# Patient Record
Sex: Female | Born: 1955 | Race: White | Hispanic: No | Smoking: Former smoker
Health system: Southern US, Community
[De-identification: ages and names within clinical notes are randomized; demographics above are authoritative.]

## PROBLEM LIST (undated history)

## (undated) DIAGNOSIS — M542 Cervicalgia: Secondary | ICD-10-CM

## (undated) DIAGNOSIS — M545 Low back pain, unspecified: Secondary | ICD-10-CM

## (undated) DIAGNOSIS — A770 Spotted fever due to Rickettsia rickettsii: Secondary | ICD-10-CM

## (undated) DIAGNOSIS — R5381 Other malaise: Secondary | ICD-10-CM

## (undated) DIAGNOSIS — A6920 Lyme disease, unspecified: Secondary | ICD-10-CM

## (undated) DIAGNOSIS — S82899A Other fracture of unspecified lower leg, initial encounter for closed fracture: Secondary | ICD-10-CM

## (undated) DIAGNOSIS — I10 Essential (primary) hypertension: Secondary | ICD-10-CM

## (undated) DIAGNOSIS — R5383 Other fatigue: Secondary | ICD-10-CM

## (undated) DIAGNOSIS — IMO0001 Reserved for inherently not codable concepts without codable children: Secondary | ICD-10-CM

## (undated) DIAGNOSIS — K219 Gastro-esophageal reflux disease without esophagitis: Secondary | ICD-10-CM

## (undated) DIAGNOSIS — R635 Abnormal weight gain: Secondary | ICD-10-CM

## (undated) HISTORY — DX: Low back pain: M54.5

## (undated) HISTORY — DX: Other fracture of unspecified lower leg, initial encounter for closed fracture: S82.899A

## (undated) HISTORY — DX: Cervicalgia: M54.2

## (undated) HISTORY — DX: Spotted fever due to Rickettsia rickettsii: A77.0

## (undated) HISTORY — DX: Reserved for inherently not codable concepts without codable children: IMO0001

## (undated) HISTORY — PX: ANKLE SURGERY: SHX546

## (undated) HISTORY — DX: Abnormal weight gain: R63.5

## (undated) HISTORY — DX: Other fatigue: R53.83

## (undated) HISTORY — DX: Lyme disease, unspecified: A69.20

## (undated) HISTORY — DX: Low back pain, unspecified: M54.50

## (undated) HISTORY — DX: Essential (primary) hypertension: I10

## (undated) HISTORY — DX: Other malaise: R53.81

## (undated) HISTORY — DX: Gastro-esophageal reflux disease without esophagitis: K21.9

---

## 2011-10-01 DIAGNOSIS — A6920 Lyme disease, unspecified: Secondary | ICD-10-CM

## 2011-10-01 HISTORY — DX: Lyme disease, unspecified: A69.20

## 2013-09-15 ENCOUNTER — Encounter: Payer: Self-pay | Admitting: Neurology

## 2013-09-17 ENCOUNTER — Ambulatory Visit (INDEPENDENT_AMBULATORY_CARE_PROVIDER_SITE_OTHER): Payer: BC Managed Care – PPO | Admitting: Neurology

## 2013-09-17 ENCOUNTER — Encounter: Payer: Self-pay | Admitting: Neurology

## 2013-09-17 ENCOUNTER — Encounter (INDEPENDENT_AMBULATORY_CARE_PROVIDER_SITE_OTHER): Payer: Self-pay

## 2013-09-17 VITALS — BP 141/84 | HR 54 | Ht 68.5 in | Wt 182.0 lb

## 2013-09-17 DIAGNOSIS — IMO0001 Reserved for inherently not codable concepts without codable children: Secondary | ICD-10-CM | POA: Insufficient documentation

## 2013-09-17 HISTORY — DX: Reserved for inherently not codable concepts without codable children: IMO0001

## 2013-09-17 MED ORDER — DULOXETINE HCL 30 MG PO CPEP: ORAL_CAPSULE | ORAL | Status: DC

## 2013-09-17 NOTE — Patient Instructions (Signed)

## 2013-09-17 NOTE — Progress Notes (Signed)
Reason for visit: Neuromuscular discomfort  Carmen Wallace is a 57 y.o. female  History of present illness:  Carmen Wallace is a 57 year old right-handed white female with a history of [place] spotted fever and Lyme disease at the same time. The patient indicates that she began to have some fatigue in the summer of 2013, and suddenly awakened one morning in September 2013 with difficulty with concentration, chest pain, neck stiffness, headache, and diffuse muscular discomfort. The patient went to an urgent medical care, and was sent to the hospital. The patient spent 4 days in the hospital in [place], [place] and was eventually diagnosed with [facility] spotted fever and Lyme disease. The patient was given a 30 day course of antibiotics, but the symptoms seemed to return. The patient went to a specialist for Lyme disease and was placed on antibiotics for 3 months, and her symptoms resolved for a number of months. The patient felt normal from February of 2014 until July 2014. At that point, her fatigue symptoms and muscle aching returned. The patient has had headaches off and on, and over the last 2 months, the headaches have been daily in nature. The patient does have some crepitus in the neck, and some neck stiffness. The patient has pain in the muscles of the shoulders, upper arms, back, thighs. The patient has numbness of the left arm. The patient denies any problems controlling the bowels or the bladder. The patient has no significant change in balance, no falls, and she does report some problems with concentration. The patient does not sleep well at night even though she is fatigued. The patient was given another 30 day course of antibiotics without benefit. The patient is sent to this office for further evaluation. A recent MRI of the cervical spine was done, and the disc was brought for my review. This appears to show some mild spondylosis without spinal cord involvement or nerve root  impingement by my reading. The patient is currently on no medications.  Past Medical History  Diagnosis Date  . [facility] spotted fever   . Fx ankle   . Lyme disease 2013  . Unspecified essential hypertension   . Cervicalgia   . Other malaise and fatigue   . Weight gain   . Esophageal reflux   . Lumbago   . Fatigue   . Myalgia and myositis, unspecified 09/17/2013    Past Surgical History  Procedure Laterality Date  . Ankle surgery      Fracture    Family History  Problem Relation Age of Onset  . Hypertension Mother   . Diabetes Mother   . Heart disease Mother   . Hypertension Father   . Diabetes Father   . Heart disease Sister   . Diabetes Sister     Social history:  reports that she has quit smoking. Her smoking use included Cigarettes. She smoked 0.00 packs per day for 20 years. She has never used smokeless tobacco. She reports that she drinks alcohol. She reports that she does not use illicit drugs.  Medications:  No current outpatient prescriptions on file prior to visit.   No current facility-administered medications on file prior to visit.      Allergies  Allergen Reactions  . Codeine Nausea Only  . Opium Nausea Only    ROS:  Out of a complete 14 system review of symptoms, the patient complains only of the following symptoms, and all other reviewed systems are negative.  Fatigue Ringing in  the ears Blurred vision Memory loss, confusion, headache, numbness, weakness, dizziness Achy muscles Insomnia, decreased energy, disinterest in activities, sleepiness  Blood pressure 141/84, pulse 54, height 5' 8.5" (1.74 m), weight 182 lb (82.555 kg).  Physical Exam  General: The patient is alert and cooperative at the time of the examination.  Head: Pupils are equal, round, and reactive to light. Discs are flat bilaterally.  Neck: The neck is supple, no carotid bruits are noted.  Respiratory: The respiratory examination is clear.  Cardiovascular:  The cardiovascular examination reveals a regular rate and rhythm, no obvious murmurs or rubs are noted.  Neuromuscular: The patient has good range of movement of the cervical spine and lumbosacral spine.  Skin: Extremities are without significant edema.  Neurologic Exam  Mental status: Mini-Mental status examination done today shows a total score 29/30.  Cranial nerves: Facial symmetry is present. There is good sensation of the face to pinprick and soft touch bilaterally. The strength of the facial muscles and the muscles to head turning and shoulder shrug are normal bilaterally. Speech is well enunciated, no aphasia or dysarthria is noted. Extraocular movements are full. Visual fields are full.  Motor: The motor testing reveals 5 over 5 strength of all 4 extremities. Good symmetric motor tone is noted throughout.  Sensory: Sensory testing is intact to pinprick, soft touch, vibration sensation, and position sense on all 4 extremities. No evidence of extinction is noted.  Coordination: Cerebellar testing reveals good finger-nose-finger and heel-to-shin bilaterally.  Gait and station: Gait is normal. Tandem gait is normal. Romberg is negative. No drift is seen.  Reflexes: Deep tendon reflexes are symmetric and normal bilaterally. Toes are downgoing bilaterally.   Assessment/Plan:  1. Probable fibromyalgia  2. History of Lyme disease and [place] spotted fever  The patient has a normal clinical examination at this time. The patient reports symptoms that are fully consistent with fibromyalgia. The patient will undergo further blood work evaluation, and undergo MRI evaluation of the brain to exclude demyelinating disease. The patient will be placed on Cymbalta, and she will followup in 3-4 months.  Marlan Palau MD 09/18/2013 9:20 AM  [facility] [address] [place], [place] [address]  Phone [phone] Fax [phone]

## 2013-09-20 ENCOUNTER — Telehealth: Payer: Self-pay | Admitting: Neurology

## 2013-09-20 LAB — COPPER, SERUM: Copper: 100 ug/dL (ref 72–166)

## 2013-09-20 LAB — ENA+DNA/DS+SJORGEN'S
ENA RNP Ab: 1.8 AI — ABNORMAL HIGH (ref 0.0–0.9)
ENA SSA (RO) Ab: 0.2 AI (ref 0.0–0.9)
dsDNA Ab: <1 IU/mL (ref 0–9)

## 2013-09-20 LAB — ANGIOTENSIN CONVERTING ENZYME: Angio Convert Enzyme: 46 U/L (ref 14–82)

## 2013-09-20 LAB — ANA W/REFLEX: Anti Nuclear Antibody(ANA): POSITIVE — AB

## 2013-09-20 LAB — HIV ANTIBODY (ROUTINE TESTING W REFLEX): HIV 1/O/2 Abs-Index Value: <1 (ref <1.00)

## 2013-09-20 LAB — RHEUMATOID FACTOR: Rhuematoid fact SerPl-aCnc: 8.1 IU/mL (ref 0.0–13.9)

## 2013-09-20 NOTE — Telephone Encounter (Signed)
I called the patient. The blood work was unremarkable with exception that the ANA was positive in low titer with the RN P. antibody. I'm not sure that this has any clinical significance. I'll contact the patient once the MRI of the brain is done.

## 2013-10-12 ENCOUNTER — Ambulatory Visit
Admission: RE | Admit: 2013-10-12 | Discharge: 2013-10-12 | Disposition: A | Discharge status: Home / Self Care | Payer: BC Managed Care – PPO | Source: Ambulatory Visit | Attending: Neurology | Admitting: Neurology

## 2013-10-12 DIAGNOSIS — IMO0001 Reserved for inherently not codable concepts without codable children: Secondary | ICD-10-CM

## 2013-10-13 ENCOUNTER — Telehealth: Payer: Self-pay | Admitting: Neurology

## 2013-10-13 NOTE — Telephone Encounter (Signed)
I called patient. MRI the brain was normal. The patient has a left parotid cyst, and evidence of lymph node enlargements in the neck area. If this has not been addressed, a lymph node biopsy may be important in the future, as it may be relevant to her diagnosis.

## 2013-10-14 DIAGNOSIS — IMO0001 Reserved for inherently not codable concepts without codable children: Secondary | ICD-10-CM

## 2013-11-19 ENCOUNTER — Telehealth: Payer: Self-pay

## 2013-11-19 ENCOUNTER — Other Ambulatory Visit: Payer: Self-pay

## 2013-11-19 DIAGNOSIS — C20 Malignant neoplasm of rectum: Secondary | ICD-10-CM

## 2013-11-19 NOTE — Telephone Encounter (Signed)
Left message on machine to call back  

## 2013-11-19 NOTE — Telephone Encounter (Signed)
EUS 11/25/13 730 am needs instrucitons

## 2013-11-22 NOTE — Telephone Encounter (Signed)
Pt went to [facility]

## 2013-11-25 ENCOUNTER — Encounter (HOSPITAL_COMMUNITY): Admission: RE | Payer: Self-pay | Source: Ambulatory Visit

## 2013-11-25 ENCOUNTER — Ambulatory Visit (HOSPITAL_COMMUNITY)
Admission: RE | Admit: 2013-11-25 | Payer: BC Managed Care – PPO | Source: Ambulatory Visit | Admitting: Gastroenterology

## 2013-11-25 SURGERY — ULTRASOUND, LOWER GI TRACT, ENDOSCOPIC
Anesthesia: Moderate Sedation

## 2014-01-13 ENCOUNTER — Ambulatory Visit: Payer: BC Managed Care – PPO | Admitting: Nurse Practitioner

## 2014-01-13 ENCOUNTER — Encounter: Payer: Self-pay | Admitting: Neurology

## 2014-02-24 ENCOUNTER — Encounter: Payer: Self-pay | Admitting: Adult Health

## 2014-02-24 ENCOUNTER — Ambulatory Visit (INDEPENDENT_AMBULATORY_CARE_PROVIDER_SITE_OTHER): Payer: BC Managed Care – PPO | Admitting: Adult Health

## 2014-02-24 VITALS — BP 129/86 | HR 64 | Ht 67.75 in | Wt 189.0 lb

## 2014-02-24 DIAGNOSIS — R519 Headache, unspecified: Secondary | ICD-10-CM | POA: Insufficient documentation

## 2014-02-24 DIAGNOSIS — IMO0001 Reserved for inherently not codable concepts without codable children: Secondary | ICD-10-CM

## 2014-02-24 DIAGNOSIS — R200 Anesthesia of skin: Secondary | ICD-10-CM

## 2014-02-24 DIAGNOSIS — R209 Unspecified disturbances of skin sensation: Secondary | ICD-10-CM

## 2014-02-24 DIAGNOSIS — R51 Headache: Secondary | ICD-10-CM

## 2014-02-24 MED ORDER — DULOXETINE HCL 30 MG PO CPEP: ORAL_CAPSULE | ORAL | Status: AC

## 2014-02-24 NOTE — Progress Notes (Signed)
I have read the note, and I agree with the clinical assessment and plan.  Charles K Willis   

## 2014-02-24 NOTE — Progress Notes (Signed)
Carmen Wallace: Carmen Wallace DOB: July 19, 1956  REASON FOR VISIT: follow up HISTORY FROM: Carmen Wallace  HISTORY OF PRESENT ILLNESS: Carmen Wallace is a 58 year old right handed white female with a history of [place] fever, lyme disease and fibromyalgia. She returns today for a follow-up. Carmen Wallace was started on Cymbalta and reports that it helped initially but she was diagnosed with rectal cancer in February and stopped the medication. She was started on chemotherapy and radiation and therefore stopped the Cymbalta while doing treatment. She has been done with treatment since April and would like to start the Cymbalta back. She has surgery scheduled June 10th to remove cancer. She states that afterwards they may have to do more chemotherapy. She continues to have headaches and muscle aches. Carmen Wallace reports that she has tinnitus after being diagnosed lyme disease. She also has some numbness and tingling in the hands that started after being diagnosed with lyme disease. However she states that she has had numbness and tingling in her left arm that was there before lyme disease. States that she will wake up in the middle of the night and her hands will be numb. She returns today for an evaluation.   History 09/17/13 (CW): Carmen Wallace is a 58 year old right-handed white female with a history of [facility] spotted fever and Lyme disease at the same time. The Carmen Wallace indicates that she began to have some fatigue in the summer of 2013, and suddenly awakened one morning in September 2013 with difficulty with concentration, chest pain, neck stiffness, headache, and diffuse muscular discomfort. The Carmen Wallace went to an urgent medical care, and was sent to the hospital. The Carmen Wallace spent 4 days in the hospital in [place], [place] and was eventually diagnosed with [facility] spotted fever and Lyme disease. The Carmen Wallace was given a 30 day course of antibiotics, but the symptoms seemed to return. The Carmen Wallace  went to a specialist for Lyme disease and was placed on antibiotics for 3 months, and her symptoms resolved for a number of months. The Carmen Wallace felt normal from February of 2014 until July 2014. At that point, her fatigue symptoms and muscle aching returned. The Carmen Wallace has had headaches off and on, and over the last 2 months, the headaches have been daily in nature. The Carmen Wallace does have some crepitus in the neck, and some neck stiffness. The Carmen Wallace has pain in the muscles of the shoulders, upper arms, back, thighs. The Carmen Wallace has numbness of the left arm. The Carmen Wallace denies any problems controlling the bowels or the bladder. The Carmen Wallace has no significant change in balance, no falls, and she does report some problems with concentration. The Carmen Wallace does not sleep well at night even though she is fatigued. The Carmen Wallace was given another 30 day course of antibiotics without benefit. The Carmen Wallace is sent to this office for further evaluation. A recent MRI of the cervical spine was done, and the disc was brought for my review. This appears to show some mild spondylosis without spinal cord involvement or nerve root impingement by my reading. The Carmen Wallace is currently on no medications.  REVIEW OF SYSTEMS: Full 14 system review of systems performed and notable only for:  Constitutional: activity change, fatigue Eyes: N/A Ear/Nose/Throat: ear pain, ringing in ears  Skin: itching   Cardiovascular: palpitations   Respiratory: cough Gastrointestinal: rectal pain, incontinence of bowels  Genitourinary: incontinence of bladder, frequency of urination, urgency Hematology/Lymphatic: N/A  Endocrine: N/A Musculoskeletal:N/A  Allergy/Immunology: N/A  Neurological: memory  loss, headache, numbness, weakness Psychiatric: agitation, depression, nervous.anxious Sleep: insomnia, daytime sleepiness   ALLERGIES: Allergies  Allergen Reactions  . Codeine Nausea Only  . Opium Nausea Only    HOME  MEDICATIONS: Outpatient Prescriptions Prior to Visit  Medication Sig Dispense Refill  . DULoxetine (CYMBALTA) 30 MG capsule One tablet daily for 2 weeks, then begin 1 tablet twice daily  60 capsule  3   No facility-administered medications prior to visit.    PAST MEDICAL HISTORY: Past Medical History  Diagnosis Date  . [facility] spotted fever   . Fx ankle   . Lyme disease 2013  . Unspecified essential hypertension   . Cervicalgia   . Other malaise and fatigue   . Weight gain   . Esophageal reflux   . Lumbago   . Fatigue   . Myalgia and myositis, unspecified 09/17/2013    PAST SURGICAL HISTORY: Past Surgical History  Procedure Laterality Date  . Ankle surgery      Fracture    FAMILY HISTORY: Family History  Problem Relation Age of Onset  . Hypertension Mother   . Diabetes Mother   . Heart disease Mother   . Hypertension Father   . Diabetes Father   . Heart disease Sister   . Diabetes Sister     SOCIAL HISTORY: History   Social History  . Marital Status: Married    Spouse Name: Carmen Wallace    Number of Children: 2  . Years of Education: College   Occupational History  .     Social History Main Topics  . Smoking status: Former Smoker -- 20 years    Types: Cigarettes  . Smokeless tobacco: Never Used     Comment: QUIT 2006  . Alcohol Use: No     Comment: occasional- not now  . Drug Use: No  . Sexual Activity: Not on file   Other Topics Concern  . 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   Carmen Wallace has two children.   Carmen Wallace is currently on medical leave.   Carmen Wallace has a college education.   Carmen Wallace is right-handed.   Carmen Wallace drinks one cup of coffee every morning.      PHYSICAL EXAM  Filed Vitals:   02/24/14 1019  BP: 129/86  Pulse: 64  Height: 5' 7.75" (1.721 m)  Weight: 189 lb (85.73 kg)   Body mass index is 28.94 kg/(m^2).  Generalized: Well  developed, in no acute distress   Neurological examination  Mentation: Alert oriented to time, place, history taking. Follows all commands speech and language fluent Cranial nerve II-XII:  Extraocular movements were full, visual field were full on confrontational test. Motor: The motor testing reveals 5 over 5 strength of all 4 extremities. Good symmetric motor tone is noted throughout.  Sensory: Sensory testing is intact to soft touch on all 4 extremities. No evidence of extinction is noted. Negative tinel's sign. Coordination: Cerebellar testing reveals good finger-nose-finger and heel-to-shin bilaterally.  Gait and station: Gait is normal. Tandem gait is normal. Romberg is negative. No drift is seen.  Reflexes: Deep tendon reflexes are symmetric and normal bilaterally.     DIAGNOSTIC DATA (LABS, IMAGING, TESTING) - I reviewed Carmen Wallace records, labs, notes, testing and imaging myself where available.    ASSESSMENT AND PLAN 58 y.o. year old female  has a past medical history of [facility] spotted fever; Fx ankle; Lyme disease (2013);  Unspecified essential hypertension; Cervicalgia; Other malaise and fatigue; Weight gain; Esophageal reflux; Lumbago; Fatigue; and Myalgia and myositis, unspecified (09/17/2013). here with   1. Myalgia and myositis, unspecified 2. Headache(784.0) 3. Numbness in both hands  Carmen Wallace was diagnosed with rectal cancer and started on chemotherapy and radiation. Carmen Wallace stopped taking Cymbalta while she was receiving treatment. She has completed chemo for now and would like to restart Cymbalta. She continues to have muscles aches and headaches. We will restart Cymbalta, Carmen Wallace should take 1 tablet (30 mg) daily for 2 weeks then increase to 1 tablet BID. If the Carmen Wallace is tolerating this medication and finds it beneficial we will taper her off the Prozac. Carmen Wallace is currently on low dose Prozac for anxiety and depression. Carmen Wallace is also having some numbness and  tingling in both hands which could be related to lyme disease however Carmen Wallace reports that she has had numbness and tingling in the left hand and arm before she was diagnosed with lyme disease. We will order nerve conduction studies on both arms. Carmen Wallace should follow up in 6 months or sooner if needed.    Ward Givens, MSN, NP-C 02/24/2014, 10:32 AM [facility] [address], [place], [place] [address] [phone]  Note: This document was prepared with digital dictation and possible smart phrase technology. Any transcriptional errors that result from this process are unintentional.

## 2014-02-28 ENCOUNTER — Telehealth: Payer: Self-pay | Admitting: Radiology

## 2014-02-28 ENCOUNTER — Encounter: Payer: BC Managed Care – PPO | Admitting: Radiology

## 2014-02-28 NOTE — Telephone Encounter (Signed)
Patient called and cancelled NCV only  appt. < ,24 hours.

## 2014-04-04 ENCOUNTER — Ambulatory Visit (INDEPENDENT_AMBULATORY_CARE_PROVIDER_SITE_OTHER): Payer: BC Managed Care – PPO

## 2014-04-04 DIAGNOSIS — G5600 Carpal tunnel syndrome, unspecified upper limb: Secondary | ICD-10-CM

## 2014-04-04 DIAGNOSIS — R200 Anesthesia of skin: Secondary | ICD-10-CM

## 2014-04-04 DIAGNOSIS — R209 Unspecified disturbances of skin sensation: Secondary | ICD-10-CM

## 2014-04-04 DIAGNOSIS — G5602 Carpal tunnel syndrome, left upper limb: Secondary | ICD-10-CM

## 2014-04-05 ENCOUNTER — Telehealth: Payer: Self-pay | Admitting: Neurology

## 2014-04-05 NOTE — Telephone Encounter (Signed)
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 the patient is to get a wrist splint and wear this over the next 6-8 weeks. If no benefit is noted, she will contact our office, and we will consider a referral to a hand surgeon.

## 2014-04-05 NOTE — Procedures (Signed)
     HISTORY:  Carmen Wallace is a 58 year old patient with a history of possible Lyme disease. She is reporting some numbness of the left hand and arm. She is being evaluated for possible neuropathy.  NERVE CONDUCTION STUDIES:  Nerve conduction studies were performed on both upper extremities. The distal motor latencies for the median nerves were prolonged on the left, normal on the right, with normal motor amplitudes for these nerves bilaterally. The distal motor latencies and motor amplitudes for the ulnar nerves were normal bilaterally. The F wave latencies and nerve conduction velocities for the median and ulnar nerves were normal bilaterally. The sensory latencies for the median nerves were prolonged on the left, normal on the right, and normal for the ulnar nerves bilaterally.  EMG STUDIES:  EMG evaluation was not performed.  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 There is no evidence of a left ulnar neuropathy on this study. No other significant abnormalities were seen.  Jill Alexanders MD 04/05/2014 7:48 AM  [facility] [address] [place], [place] [address]  Phone [phone] Fax [phone]

## 2014-04-19 ENCOUNTER — Ambulatory Visit: Payer: Self-pay | Admitting: Neurology

## 2014-04-28 ENCOUNTER — Encounter: Payer: Self-pay | Admitting: Adult Health

## 2014-05-03 ENCOUNTER — Ambulatory Visit: Payer: Self-pay | Admitting: Nurse Practitioner

## 2014-09-02 ENCOUNTER — Ambulatory Visit: Payer: BC Managed Care – PPO | Admitting: Adult Health

## 2017-06-11 ENCOUNTER — Encounter (INDEPENDENT_AMBULATORY_CARE_PROVIDER_SITE_OTHER): Payer: Self-pay | Admitting: Orthopedic Surgery

## 2017-06-11 ENCOUNTER — Ambulatory Visit (INDEPENDENT_AMBULATORY_CARE_PROVIDER_SITE_OTHER): Payer: Medicare Other

## 2017-06-11 ENCOUNTER — Ambulatory Visit (INDEPENDENT_AMBULATORY_CARE_PROVIDER_SITE_OTHER): Payer: Medicare Other | Admitting: Orthopedic Surgery

## 2017-06-11 VITALS — Resp 12 | Ht 69.0 in | Wt 175.0 lb

## 2017-06-11 DIAGNOSIS — M659 Synovitis and tenosynovitis, unspecified: Secondary | ICD-10-CM

## 2017-06-11 MED ORDER — DICLOFENAC SODIUM 1 % TD GEL: 2.0000 g | Freq: Four times a day (QID) | TRANSDERMAL | 1 refills | Status: AC

## 2017-06-11 NOTE — Progress Notes (Signed)
Office Visit Note   Patient: Carmen Wallace           Date of Birth: Oct 16, 1955           MRN: [id number] Visit Date: 06/11/2017              Requested by: Glenda Chroman, MD [place], [place] [address] PCP: Glenda Chroman, MD   Assessment & Plan: Visit Diagnoses:  1. Synovitis of left foot     Plan:  #1: Placement into a short Equalizer boot to allow the joint synovitis to improve. #2: Given her Voltaren gel as an anti-inflammatory to be used on the foot. She does have a history of rectal cancer on joint to try to avoid any anti-inflammatories orally.  Follow-Up Instructions: Return in about 2 weeks (around 06/25/2017).   Orders:  Orders Placed This Encounter  Procedures  . XR Foot Complete Left   Meds ordered this encounter  Medications  . diclofenac sodium (VOLTAREN) 1 % GEL    Sig: Apply 2 g topically 4 (four) times daily.    Dispense:  5 Tube    Refill:  1    Order Specific Question:   Supervising Provider    Answer:   Garald Balding [[id number]]      Procedures: No procedures performed   Clinical Data: No additional findings.   Subjective: Chief Complaint  Patient presents with  . Left Foot - Pain, Edema    Carmen Wallace is a 61 y o that presents with Left foot pain x 2 weeks. She relates it aches, pt works 5x week, 12hrs a day.    HPI  Carmen Wallace is a very pleasant 61 year old white female who is seen today for evaluation of of her left foot.Marland Kitchen History is that she's had 2 weeks of pain discomfort over the dorsum of her foot. 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 She stands on concrete during that time. She is placed inserts in her shoes and also been using Epsom salts soaks. Because of her continued pain and discomfort she is seen today for evaluation. She denies any numbness or tingling.  Review of Systems  Constitutional: Negative for chills, fatigue and fever.  Eyes: Negative for itching.  Respiratory: Negative for chest tightness and shortness  of breath.   Cardiovascular: Negative for chest pain, palpitations and leg swelling.  Gastrointestinal: Negative for blood in stool, constipation and diarrhea.       History of rectal cancer with resection this is stage IV  Endocrine: Negative for polyuria.  Genitourinary: Negative for dysuria.  Musculoskeletal: Negative for back pain, joint swelling, neck pain and neck stiffness.  Allergic/Immunologic: Negative for immunocompromised state.  Neurological: Negative for dizziness and numbness.  Hematological: Does not bruise/bleed easily.  Psychiatric/Behavioral: The patient is not nervous/anxious.      Objective: Vital Signs: Resp 12   Ht 5\' 9"  (1.753 m)   Wt 175 lb (79.4 kg)   BMI 25.84 kg/m   Physical Exam  Constitutional: She is oriented to person, place, and time. She appears well-developed and well-nourished.  HENT:  Head: Normocephalic and atraumatic.  Eyes: Pupils are equal, round, and reactive to light. EOM are normal.  Pulmonary/Chest: Effort normal.  Neurological: She is alert and oriented to person, place, and time.  Skin: Skin is warm and dry.  Psychiatric: She has a normal mood and affect. Her behavior is normal. Judgment and thought content normal.    Ortho  Exam  She has tenderness to palpation over the tarsometatarsal area of the midfoot near the second third and fourth metatarsals. A little bit of warmth is palpated. No pain with twisting of the midfoot. Flexors and extensors are intact but does cause her some pain with the dorsiflexion of the toes. Good capillary refill. Sensation is intact to light touch.  Specialty Comments:  No specialty comments available.  Imaging: Xr Foot Complete Left  Result Date: 06/11/2017 He does have some cystic changes in the medial heel cuboid. There is also some cystic changes at the first and second cuneiform and the second metatarsal area.    PMFS History: Patient Active Problem List   Diagnosis Date Noted  .  Headache(784.0) 02/24/2014  . Myalgia and myositis, unspecified 09/17/2013   Past Medical History:  Diagnosis Date  . Cervicalgia   . Esophageal reflux   . Fatigue   . Fx ankle   . Lumbago   . Lyme disease 2013  . Myalgia and myositis, unspecified 09/17/2013  . Other malaise and fatigue   . [facility] spotted fever   . Unspecified essential hypertension   . Weight gain     Family History  Problem Relation Age of Onset  . Hypertension Mother   . Diabetes Mother   . Heart disease Mother   . Hypertension Father   . Diabetes Father   . Heart disease Sister   . Diabetes Sister     Past Surgical History:  Procedure Laterality Date  . ANKLE SURGERY     Fracture   Social History   Occupational History  .  [employer]   Social History Main Topics  . Smoking status: Former Smoker    Years: 20.00    Types: Cigarettes  . Smokeless tobacco: Never Used     Comment: QUIT 2006  . Alcohol use No     Comment: occasional- not now  . Drug use: No  . Sexual activity: Not on file

## 2017-06-25 ENCOUNTER — Ambulatory Visit (INDEPENDENT_AMBULATORY_CARE_PROVIDER_SITE_OTHER): Payer: Medicare (Managed Care) | Admitting: Orthopaedic Surgery

## 2018-07-03 ENCOUNTER — Other Ambulatory Visit: Payer: Self-pay | Admitting: Internal Medicine

## 2018-07-03 DIAGNOSIS — N644 Mastodynia: Secondary | ICD-10-CM

## 2018-07-03 DIAGNOSIS — Z1231 Encounter for screening mammogram for malignant neoplasm of breast: Secondary | ICD-10-CM

## 2018-07-22 ENCOUNTER — Other Ambulatory Visit: Payer: Self-pay

## 2018-07-29 ENCOUNTER — Ambulatory Visit
Admission: RE | Admit: 2018-07-29 | Discharge: 2018-07-29 | Disposition: A | Discharge status: Home / Self Care | Payer: Medicare Other | Source: Ambulatory Visit | Attending: Internal Medicine | Admitting: Internal Medicine

## 2018-07-29 ENCOUNTER — Ambulatory Visit: Payer: Self-pay

## 2018-07-29 DIAGNOSIS — N644 Mastodynia: Secondary | ICD-10-CM

## 2018-09-17 ENCOUNTER — Telehealth: Payer: Self-pay | Admitting: Orthopedic Surgery

## 2018-09-17 NOTE — Telephone Encounter (Signed)
Call from patient inquiring about scheduling appointment with Dr Aline Brochure specifically, as she was recommended to him for an injury to left lower leg which occurred today - thinks she may have hyper-extended it.  I scheduled first available in the new year, and added to cancellation list. Discussed options of primary care, urgent care, Emergency room.  Patient will follow through on one of these options.

## 2018-10-05 ENCOUNTER — Ambulatory Visit: Payer: Medicare Other | Admitting: Orthopedic Surgery

## 2018-10-05 ENCOUNTER — Encounter

## 2018-10-05 ENCOUNTER — Ambulatory Visit (INDEPENDENT_AMBULATORY_CARE_PROVIDER_SITE_OTHER): Payer: Medicare Other

## 2018-10-05 ENCOUNTER — Encounter: Payer: Self-pay | Admitting: Orthopedic Surgery

## 2018-10-05 VITALS — BP 146/95 | HR 65 | Ht 68.0 in | Wt 191.0 lb

## 2018-10-05 DIAGNOSIS — M25561 Pain in right knee: Secondary | ICD-10-CM

## 2018-10-05 DIAGNOSIS — M7631 Iliotibial band syndrome, right leg: Secondary | ICD-10-CM

## 2018-10-05 NOTE — Progress Notes (Signed)
NEW PATIENT OFFICE VISIT  Chief Complaint  Patient presents with  . Knee Pain    Since 12/18 not sure of how it happened but had immediate pain.    63 year old female presents with pain in her right knee after an injury in mid December  She says she was trying to block a dog from getting out of a kennel dog ran into her right leg she felt acute pain in the front of the proximal tibia and knee area could not bear weight.  She treated herself with ice Ace wrap Tylenol Advil with moderate effect.  She can now weight-bear but still has a burning sensation on the lateral part of the leg along the iliotibial band it is mild to moderate it associated with bending the knee it is not associated with any catching locking or giving way   Review of Systems  Skin: Negative for itching and rash.  Neurological: Negative for tingling and sensory change.     Past Medical History:  Diagnosis Date  . Cervicalgia   . Esophageal reflux   . Fatigue   . Fx ankle   . Lumbago   . Lyme disease 2013  . Myalgia and myositis, unspecified 09/17/2013  . Other malaise and fatigue   . [facility] spotted fever   . Unspecified essential hypertension   . Weight gain     Past Surgical History:  Procedure Laterality Date  . ANKLE SURGERY     Fracture    Family History  Problem Relation Age of Onset  . Hypertension Mother   . Diabetes Mother   . Heart disease Mother   . Hypertension Father   . Diabetes Father   . Heart disease Sister   . Diabetes Sister   . Breast cancer Maternal Aunt    Social History   Tobacco Use  . Smoking status: Former Smoker    Years: 20.00    Types: Cigarettes  . Smokeless tobacco: Never Used  . Tobacco comment: QUIT 2006  Substance Use Topics  . Alcohol use: No    Comment: occasional- not now  . Drug use: No    Allergies  Allergen Reactions  . Codeine Nausea Only  . Opium Nausea Only    No outpatient medications have been marked as taking for the 10/05/18  encounter (Office Visit) with Carole Civil, MD.    BP (!) 146/95   Pulse 65   Ht 5\' 8"  (1.727 m)   Wt 191 lb (86.6 kg)   BMI 29.04 kg/m   Physical Exam Vitals signs reviewed.  Constitutional:      Appearance: She is well-developed.  Musculoskeletal:     Right knee: She exhibits no effusion.  Neurological:     Mental Status: She is alert and oriented to person, place, and time.  Psychiatric:        Attention and Perception: Attention normal.        Mood and Affect: Mood and affect normal.        Speech: Speech normal.        Behavior: Behavior normal.        Thought Content: Thought content normal.        Judgment: Judgment normal.     Right Knee Exam   Muscle Strength  The patient has normal right knee strength.  Tenderness  Right knee tenderness location: iliotibial band at knee.  Range of Motion  Extension: normal  Flexion: normal   Tests  McMurray:  Medial -  negative Lateral - negative Varus: negative Valgus: negative Drawer:  Anterior - negative    Posterior - negative  Other  Erythema: absent Scars: absent Sensation: normal Pulse: present Swelling: none Effusion: no effusion present   Left Knee Exam   Muscle Strength  The patient has normal left knee strength.  Tenderness  The patient is experiencing no tenderness.   Range of Motion  Extension: normal  Flexion: normal   Tests  McMurray:  Medial - negative Lateral - negative Varus: negative Valgus: negative Drawer:  Anterior - negative     Posterior - negative  Other  Erythema: absent Scars: absent Sensation: normal Pulse: present Swelling: none        MEDICAL DECISION SECTION  Xrays were done at [facility]  My independent reading of xrays:  X-ray shows a normal knee  Encounter Diagnoses  Name Primary?  . Acute pain of right knee   . Iliotibial band tendinitis of right side Yes    PLAN: (Rx., injectx, surgery, frx, mri/ct) Discussed possibility of injection patient  prefers over-the-counter topical creams compression sleeve    Follow-up as needed  No orders of the defined types were placed in this encounter.   Arther Abbott, MD  10/05/2018 11:13 AM

## 2018-10-05 NOTE — Patient Instructions (Signed)
These are the muscle creams I recommend:  PLEASE READ THE PACKAGE INSTRUCTIONS BEFORE USING   Ben Gay arthritis cream  Icy hot vanishing gel  Aspercreme odor free  Myoflex Oderless pain reliever  Capzasin  Sportscreme  Max freeze 

## 2019-06-28 ENCOUNTER — Other Ambulatory Visit: Payer: Self-pay | Admitting: Internal Medicine

## 2019-06-28 DIAGNOSIS — Z1231 Encounter for screening mammogram for malignant neoplasm of breast: Secondary | ICD-10-CM

## 2019-08-13 ENCOUNTER — Other Ambulatory Visit: Payer: Self-pay

## 2019-08-13 ENCOUNTER — Ambulatory Visit
Admission: RE | Admit: 2019-08-13 | Discharge: 2019-08-13 | Disposition: A | Discharge status: Home / Self Care | Payer: Medicare Other | Source: Ambulatory Visit | Attending: Internal Medicine | Admitting: Internal Medicine

## 2019-08-13 DIAGNOSIS — Z1231 Encounter for screening mammogram for malignant neoplasm of breast: Secondary | ICD-10-CM

## 2020-06-29 ENCOUNTER — Other Ambulatory Visit: Payer: Self-pay | Admitting: Internal Medicine

## 2020-06-29 DIAGNOSIS — Z1231 Encounter for screening mammogram for malignant neoplasm of breast: Secondary | ICD-10-CM

## 2020-08-14 ENCOUNTER — Other Ambulatory Visit: Payer: Self-pay

## 2020-08-14 ENCOUNTER — Ambulatory Visit
Admission: RE | Admit: 2020-08-14 | Discharge: 2020-08-14 | Disposition: A | Discharge status: Home / Self Care | Payer: Medicare Other | Source: Ambulatory Visit | Attending: Internal Medicine | Admitting: Internal Medicine

## 2020-08-14 DIAGNOSIS — Z1231 Encounter for screening mammogram for malignant neoplasm of breast: Secondary | ICD-10-CM

## 2021-10-17 ENCOUNTER — Other Ambulatory Visit: Payer: Self-pay | Admitting: Internal Medicine

## 2021-10-17 DIAGNOSIS — Z1231 Encounter for screening mammogram for malignant neoplasm of breast: Secondary | ICD-10-CM

## 2021-11-07 ENCOUNTER — Ambulatory Visit: Payer: Medicare Other

## 2021-11-12 ENCOUNTER — Ambulatory Visit: Payer: Medicare Other

## 2021-11-20 ENCOUNTER — Ambulatory Visit
Admission: RE | Admit: 2021-11-20 | Discharge: 2021-11-20 | Disposition: A | Discharge status: Home / Self Care | Payer: Medicare Other | Source: Ambulatory Visit | Attending: Internal Medicine | Admitting: Internal Medicine

## 2021-11-20 DIAGNOSIS — Z1231 Encounter for screening mammogram for malignant neoplasm of breast: Secondary | ICD-10-CM

## 2023-09-30 IMAGING — MG MM DIGITAL SCREENING BILAT W/ TOMO AND CAD
8 series · 8 of 24 positions shown · non-contrast
Comparison: Previous exam(s).

ACR Breast Density Category a: The breast tissue is almost entirely
fatty.

CLINICAL DATA: Screening.

EXAM:
DIGITAL SCREENING BILATERAL MAMMOGRAM WITH TOMOSYNTHESIS AND CAD
TECHNIQUE: Bilateral screening digital craniocaudal and mediolateral oblique
mammograms were obtained. Bilateral screening digital breast
tomosynthesis was performed. The images were evaluated with
computer-aided detection.

[L CC synth-2D]
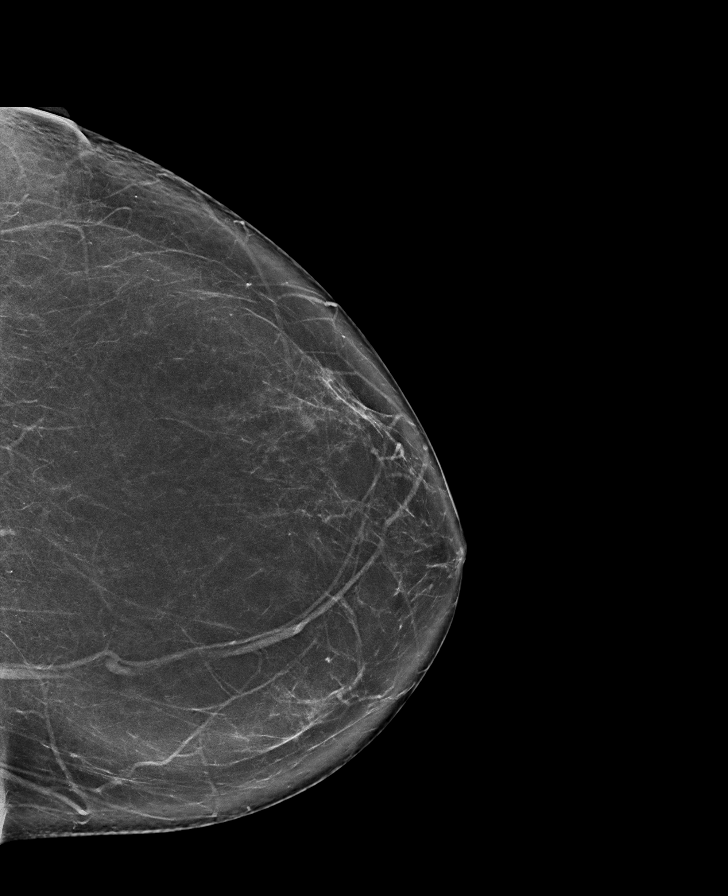

[R CC synth-2D]
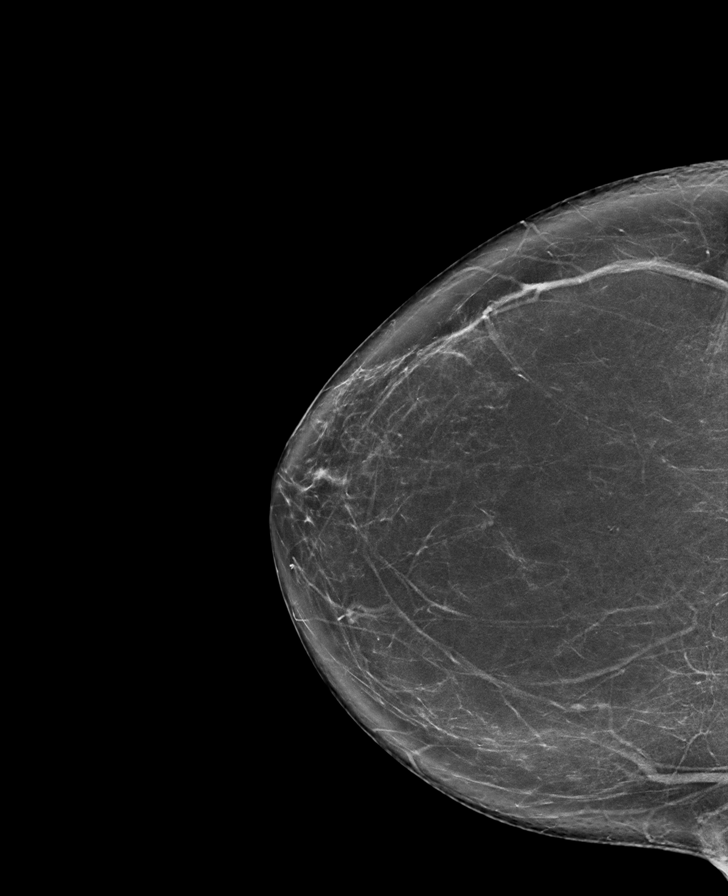

[R MLO synth-2D]
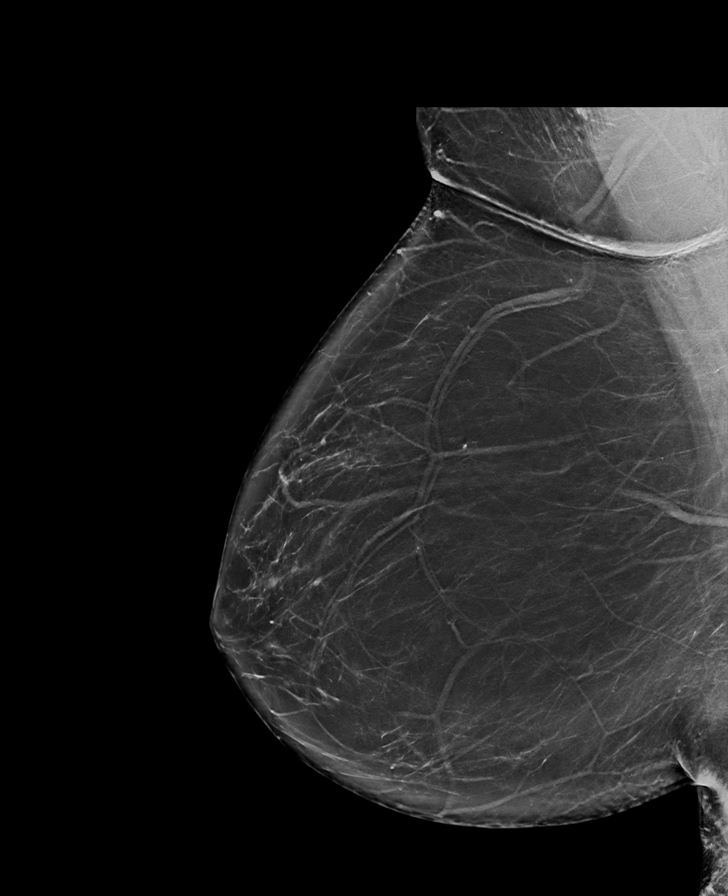

[L MLO synth-2D]
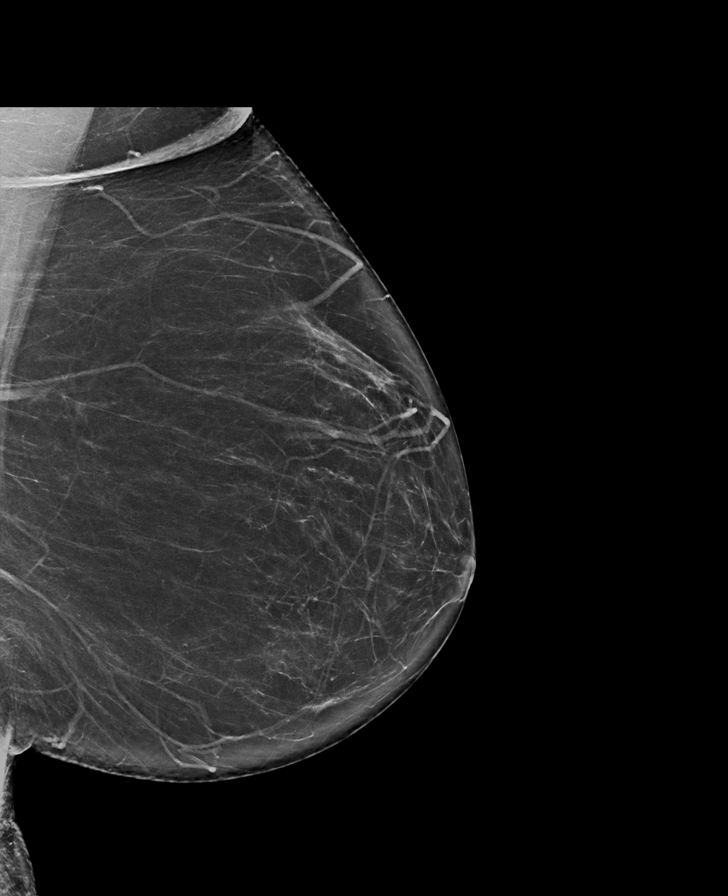

[R MLO tomo · tomo slice 47/93.0]
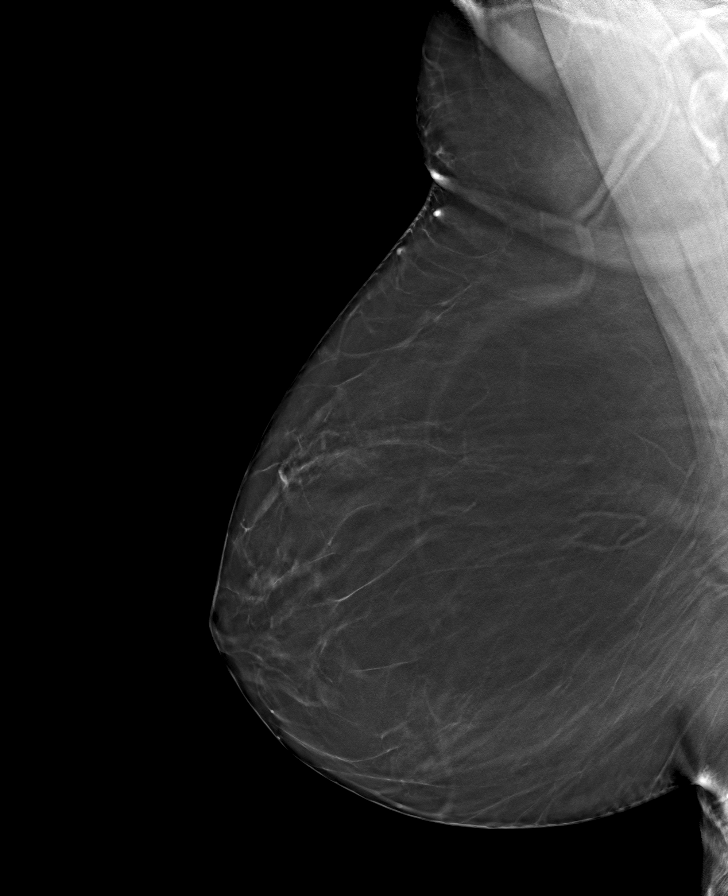

[L MLO tomo · tomo slice 43/85.0]
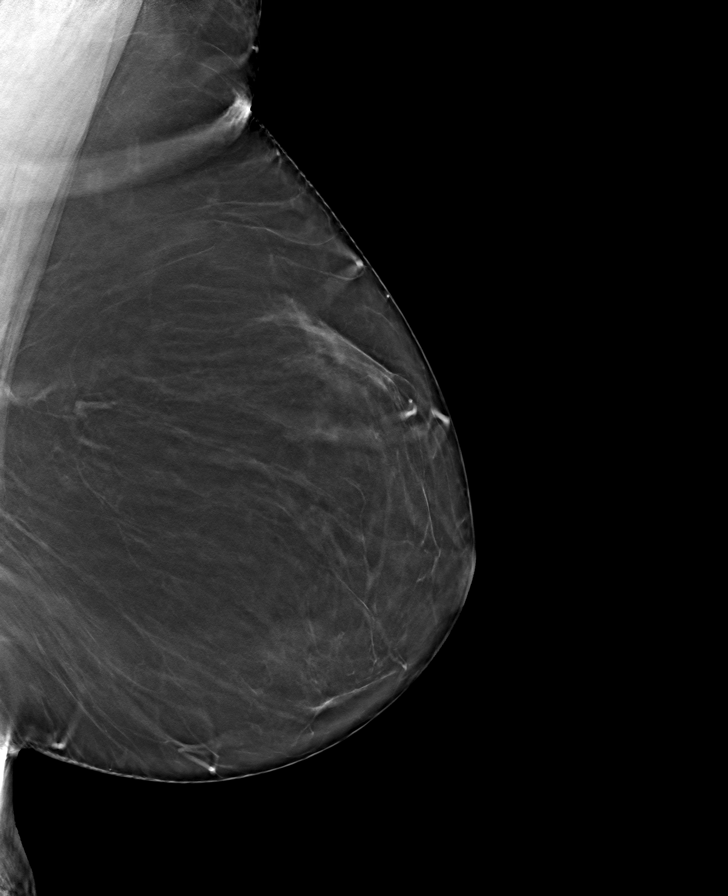

[L CC tomo · tomo slice 43/85.0]
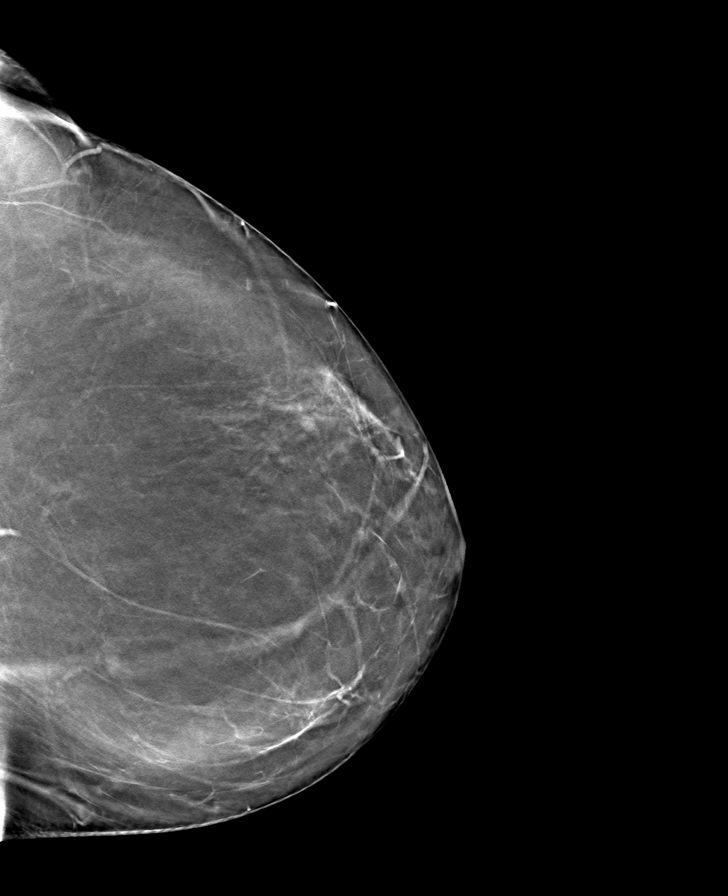

[R CC tomo · tomo slice 42/83.0]
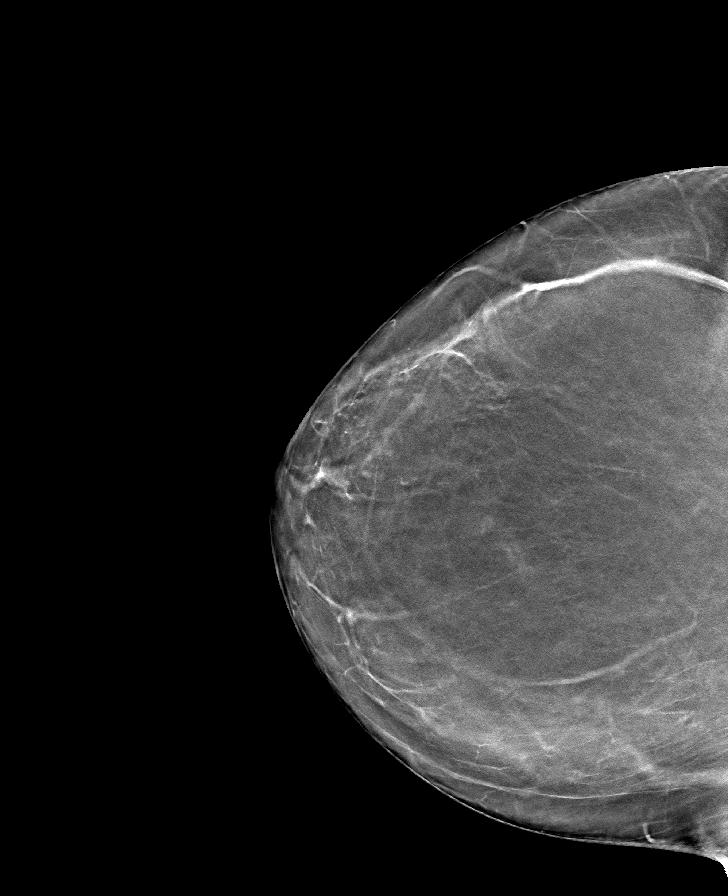

[8 of 24 positions shown; findings below may reference images not displayed]

FINDINGS: There are no findings suspicious for malignancy.
IMPRESSION: No mammographic evidence of malignancy. A result letter of this
screening mammogram will be mailed directly to the patient.

RECOMMENDATION:
Screening mammogram in one year. (Code:0E-[id number])

BI-RADS CATEGORY  1: Negative.

## 2024-06-30 ENCOUNTER — Ambulatory Visit (INDEPENDENT_AMBULATORY_CARE_PROVIDER_SITE_OTHER)

## 2024-06-30 ENCOUNTER — Ambulatory Visit: Attending: Cardiology | Admitting: Cardiology

## 2024-06-30 ENCOUNTER — Encounter: Payer: Self-pay | Admitting: Cardiology

## 2024-06-30 VITALS — BP 136/74 | HR 63 | Ht 68.0 in | Wt 188.8 lb

## 2024-06-30 DIAGNOSIS — R002 Palpitations: Secondary | ICD-10-CM | POA: Diagnosis not present

## 2024-06-30 NOTE — Patient Instructions (Addendum)
 Medication Instructions:  Your physician recommends that you continue on your current medications as directed. Please refer to the Current Medication list given to you today.  *If you need a refill on your cardiac medications before your next appointment, please call your pharmacy*  Lab Work: None If you have labs (blood work) drawn today and your tests are completely normal, you will receive your results only by: MyChart Message (if you have MyChart) OR A paper copy in the mail If you have any lab test that is abnormal or we need to change your treatment, we will call you to review the results.  Testing/Procedures: Zio Patch- 2 weeks  Follow-Up: At [facility], you and your health needs are our priority.  As part of our continuing mission to provide you with exceptional heart care, our providers are all part of one team.  This team includes your primary Cardiologist (physician) and Advanced Practice Providers or APPs (Physician Assistants and Nurse Practitioners) who all work together to provide you with the care you need, when you need it.  Your next appointment:   6 week(s)  Provider:   Almarie Crate, NP      We recommend signing up for the patient portal called MyChart.  Sign up information is provided on this After Visit Summary.  MyChart is used to connect with patients for Virtual Visits (Telemedicine).  Patients are able to view lab/test results, encounter notes, upcoming appointments, etc.  Non-urgent messages can be sent to your provider as well.   To learn more about what you can do with MyChart, go to [email or web address].   Other Instructions ZIO XT- Long Term Monitor Instructions   Your physician has requested you wear your ZIO patch monitor__14_____days.   This is a single patch monitor.  [employer] supplies one patch monitor per enrollment.  Additional stickers are not available.   Please do not apply patch if you will be having a Nuclear Stress Test,  Echocardiogram, Cardiac CT, MRI, or Chest Xray during the time frame you would be wearing the monitor. The patch cannot be worn during these tests.  You cannot remove and re-apply the ZIO XT patch monitor.   Your ZIO patch monitor will be sent USPS Priority mail from [facility] directly to your home address. The monitor may also be mailed to a PO BOX if home delivery is not available.   It may take 3-5 days to receive your monitor after you have been enrolled.   Once you have received you monitor, please review enclosed instructions.  Your monitor has already been registered assigning a specific monitor serial # to you.   Applying the monitor   Shave hair from upper left chest.   Hold abrader disc by orange tab.  Rub abrader in 40 strokes over left upper chest as indicated in your monitor instructions.   Clean area with 4 enclosed alcohol pads .  Use all pads to assure are is cleaned thoroughly.  Let dry.   Apply patch as indicated in monitor instructions.  Patch will be place under collarbone on left side of chest with arrow pointing upward.   Rub patch adhesive wings for 2 minutes.Remove white label marked 1.  Remove white label marked 2.  Rub patch adhesive wings for 2 additional minutes.   While looking in a mirror, press and release button in center of patch.  A small green light will flash 3-4 times .  This will be your only indicator the monitor has been  turned on.     Do not shower for the first 24 hours.  You may shower after the first 24 hours.   Press button if you feel a symptom. You will hear a small click.  Record Date, Time and Symptom in the Patient Log Book.   When you are ready to remove patch, follow instructions on last 2 pages of Patient Log Book.  Stick patch monitor onto last page of Patient Log Book.   Place Patient Log Book in [employer] box.  Use locking tab on box and tape box closed securely.  The [employer] and [employer] has [employer] on it.  Please  place in mailbox as soon as possible.  Your physician should have your test results approximately 7 days after the monitor has been mailed back to [facility].   Call [facility] Customer Care at [phone] if you have questions regarding your ZIO XT patch monitor.  Call them immediately if you see an orange light blinking on your monitor.   If your monitor falls off in less than 4 days contact our Monitor department at [phone].  If your monitor becomes loose or falls off after 4 days call [employer] at [phone] for suggestions on securing your monitor.

## 2024-06-30 NOTE — Progress Notes (Signed)
 Clinical Summary [name] is a 68 y.o.female seen today as a new consult, referred by NP [facility] for the following medical problems   1.Palpitations - ongoing several years, previously mild and infrequent - increase in frequency, more intense feeling. Lasts roughly 30 seconds, tries coughing hard to see if will stop, which can help. No associated SOB - coffee  2-3 cups a week, green tea one bottle daily, no sodas, no energy, rare EtoH - she is on methyphenidate, takes most days of the week for energy. Has been on off and on for 7-8 years    2.Metastatic rectal cacner - followed by [facility] - s/p chemoradiation, resection Past Medical History:  Diagnosis Date   Cervicalgia    Esophageal reflux    Fatigue    Fx ankle    Lumbago    Lyme disease 2013   Myalgia and myositis, unspecified 09/17/2013   Other malaise and fatigue    [place] spotted fever    Unspecified essential hypertension    Weight gain      Allergies  Allergen Reactions   Codeine Nausea Only   Opium Nausea Only     Current Outpatient Medications  Medication Sig Dispense Refill   Co-Enzyme Q-10 100 MG CAPS Take 2 capsules by mouth every morning.     diclofenac  sodium (VOLTAREN ) 1 % GEL Apply 2 g topically 4 (four) times daily. (Patient not taking: Reported on 10/05/2018) 5 Tube 1   DULoxetine  (CYMBALTA ) 30 MG capsule One tablet daily for 2 weeks, then begin 1 tablet twice daily (Patient not taking: Reported on 10/05/2018) 60 capsule 3   erythromycin base (E-MYCIN) 500 MG tablet Take 500 mg by mouth. TAke 2 tablets at 3 pm. 4 pm and 10 pm before your operation.     FLUoxetine (PROZAC) 20 MG capsule Take 20 mg by mouth daily.     LORazepam (ATIVAN) 1 MG tablet Take 1 mg by mouth at bedtime as needed for anxiety.     neomycin (MYCIFRADIN) 500 MG tablet Take 500 mg by mouth. Take 2 tablets at 3 pm, 4 pm and 10 pm the day before your operation.     omeprazole (PRILOSEC) 20 MG capsule Take 20 mg  by mouth daily.     ondansetron (ZOFRAN) 8 MG tablet Take by mouth 2 (two) times daily. 30 minutes prior to each dose of oral chemotherapy to prevent nausea.     prochlorperazine (COMPAZINE) 10 MG tablet Take 10 mg by mouth every 6 (six) hours as needed for nausea or vomiting.     saccharomyces boulardii (FLORASTOR) 250 MG capsule Take 250 mg by mouth daily.     No current facility-administered medications for this visit.     Past Surgical History:  Procedure Laterality Date   ANKLE SURGERY     Fracture     Allergies  Allergen Reactions   Codeine Nausea Only   Opium Nausea Only      Family History  Problem Relation Age of Onset   Hypertension Mother    Diabetes Mother    Heart disease Mother    Hypertension Father    Diabetes Father    Heart disease Sister    Diabetes Sister    Breast cancer Maternal Aunt      Social History Ms. Mcneese reports that she has quit smoking. Her smoking use included cigarettes. She has never used smokeless tobacco. Ms. Widdowson reports no history of alcohol use.  Physical Examination Today's Vitals   06/30/24 1052  BP: 136/74  Pulse: 63  SpO2: 97%  Weight: 188 lb 12.8 oz (85.6 kg)  Height: 5' 8 (1.727 m)   Body mass index is 28.71 kg/m.  Gen: resting comfortably, no acute distress HEENT: no scleral icterus, pupils equal round and reactive, no palptable cervical adenopathy,  CV: RRR, no mrg, no jvd Resp: Clear to auscultation bilaterally GI: abdomen is soft, non-tender, non-distended, normal bowel sounds, no hepatosplenomegaly MSK: extremities are warm, no edema.  Skin: warm, no rash Neuro:  no focal deficits Psych: appropriate affect    Assessment and Plan  1.Palpitations - EKG today shows NSR - plan for 2 week zio patch to further evaluate   F/u 6 weeks      Dorn PHEBE Ross, M.D.,

## 2024-07-16 ENCOUNTER — Ambulatory Visit: Admitting: Cardiology

## 2024-08-13 ENCOUNTER — Ambulatory Visit: Admitting: Nurse Practitioner

## 2024-08-30 DIAGNOSIS — R002 Palpitations: Secondary | ICD-10-CM | POA: Diagnosis not present

## 2024-09-15 ENCOUNTER — Ambulatory Visit: Payer: Self-pay | Admitting: Cardiology

## 2024-09-16 ENCOUNTER — Telehealth: Payer: Self-pay | Admitting: Cardiology

## 2024-09-16 NOTE — Telephone Encounter (Signed)
 Left message to return call  - left detailed voice message in regards to results - also suggested she may reply via mychart as well.

## 2024-09-16 NOTE — Telephone Encounter (Signed)
 Addressed in result notes

## 2024-09-16 NOTE — Telephone Encounter (Signed)
 Pt was returning nurse call regarding results and is requesting a callback. Please advise

## 2024-09-29 MED ORDER — METOPROLOL TARTRATE 25 MG PO TABS: 12.5000 mg | ORAL_TABLET | Freq: Two times a day (BID) | ORAL | 6 refills | Status: DC

## 2024-09-29 NOTE — Telephone Encounter (Signed)
 Patient notified and verbalized understanding.  Copy to pcp.  Will send new medication to [facility] now.   LONG TERM MONITOR (3-14 DAYS) Bernett Dorothyann LABOR, RN    09/24/24 11:33 AM Result Note 12/26 lmtcb LONG TERM MONITOR (3-14 DAYS) Me    09/16/24  5:29 PM Note Left message to return call  - left detailed voice message in regards to results - also suggested she may reply via mychart as well.        Encounter-level documentation   09/16/24  5:28 PM You attempted to contact (Name not recorded) (Left Message)    09/16/24 12:22 PM Result Note Left message to return call. LONG TERM MONITOR (3-14 DAYS) Branch, Dorn FALCON, MD to Me (Selected Message)

## 2024-09-29 NOTE — Telephone Encounter (Signed)
-----   Message from Dorn Ross, MD sent at 09/15/2024  4:14 PM EST ----- Monitor shows some extra heart beats at times, at times she would have runs of an abnormal rhythm called and SVT which is not dangerous by can cause the heart to speed up. If still having symptoms  can start lopressor 12.5mg  bid  JINNY Ross MD

## 2024-11-02 ENCOUNTER — Ambulatory Visit: Admitting: Nurse Practitioner

## 2024-11-02 ENCOUNTER — Encounter: Payer: Self-pay | Admitting: Nurse Practitioner

## 2024-11-02 VITALS — BP 136/88 | HR 65 | Ht 68.0 in | Wt 187.8 lb

## 2024-11-02 DIAGNOSIS — R002 Palpitations: Secondary | ICD-10-CM | POA: Diagnosis not present

## 2024-11-02 DIAGNOSIS — E876 Hypokalemia: Secondary | ICD-10-CM | POA: Diagnosis not present

## 2024-11-02 DIAGNOSIS — R5383 Other fatigue: Secondary | ICD-10-CM

## 2024-11-02 DIAGNOSIS — I10 Essential (primary) hypertension: Secondary | ICD-10-CM | POA: Diagnosis not present

## 2024-11-02 MED ORDER — POTASSIUM CHLORIDE CRYS ER 20 MEQ PO TBCR: 20.0000 meq | EXTENDED_RELEASE_TABLET | Freq: Every day | ORAL | 5 refills | Status: AC

## 2024-11-02 NOTE — Progress Notes (Unsigned)
 " Cardiology Office Note   Date:  11/02/2024 ID:  Carmen Wallace, DOB [birth year], MRN [id number] PCP: Rosamond Leta NOVAK, MD  [facility] Providers Cardiologist:  Alvan Carrier, MD     History of Present Illness Carmen Wallace is a 69 y.o. female with a PMH of palpitations, hypertension, fatigue, metastatic rectal cancer, s/p chemoradiation and resection, who presents today for overdue follow-up.  Last seen by Dr. Alvan on June 30, 2024.  Patient had noted palpitations that been ongoing for several years with recent increase in frequency, stated lasting roughly 30 seconds, stated sometimes coughing could help.  Noted to be on methylphenidate, noted to take most days a week for energy, had been off and on for about 7 to 8 years. Wore a Zio patch - see full report below.   She is here for follow-up.  Tells me she has not started Lopressor  since last office visit as she read and reviewed the side effects of this medication.  Already admits to chronic fatigue due to having received chemo and history of Lyme disease, resting heart rate around 57 bpm per her report and lower resting blood pressure.  Does note she gets palpitations on occasions, does take Ritalin that is being managed by another provider.  Does state she drinks about 3 cups of coffee per week, some green tea, but the rest the time drinks water. Denies any chest pain, shortness of breath, syncope, presyncope, dizziness, orthopnea, PND, swelling or significant weight changes, acute bleeding, or claudication.  ROS: Negative. See HPI.   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   Studies Reviewed  EKG: EKG is not ordered today.   Cardiac monitor 08/2024:    13 day monitor   Rare supraventricular ectopy in the form of isolated PACs, couplets, triplets. 20 runs of SVT longest 26.5 seconds.   Rare ventricular ectopy in the form of isolated PVCs   No symptoms reported      Patch Wear Time:  13 days and 1 hours (2025-10-01T11:17:53-0400 to 2025-10-14T13:07:08-399)   Patient had a min HR of 45 bpm, max HR of 190 bpm, and avg HR of 68 bpm. Predominant underlying rhythm was Sinus Rhythm. 20 Supraventricular Tachycardia runs occurred, the run with the fastest interval lasting 5 beats with a max rate of 190 bpm, the  longest lasting 26.5 secs with an avg rate of 102 bpm. Isolated SVEs were rare (<1.0%), SVE Couplets were rare (<1.0%), and SVE Triplets were rare (<1.0%). Isolated VEs were rare (<1.0%), and no VE Couplets or VE Triplets were present.    Physical Exam VS:  BP 136/88 (BP Location: Left Arm)   Pulse 65   Ht 5' 8 (1.727 m)   Wt 187 lb 12.8 oz (85.2 kg)   SpO2 98%   BMI 28.55 kg/m        Wt Readings from Last 3 Encounters:  11/02/24 187 lb 12.8 oz (85.2 kg)  06/30/24 188 lb 12.8 oz (85.6 kg)  10/05/18 191 lb (86.6 kg)    GEN: Well nourished, well developed in no acute distress NECK: No JVD; No carotid bruits CARDIAC: S1/S2, RRR, no murmurs, rubs, gallops RESPIRATORY:  Clear to auscultation without rales, wheezing or rhonchi  ABDOMEN: Soft, non-tender, non-distended EXTREMITIES:  No edema; No deformity   ASSESSMENT AND PLAN  Palpitations Does continue to note palpitations on occasion, heart rate is 65 bpm, has not started Lopressor .  Agree  with her hesitancies to begin medication and will hold off starting this-will remove off her med list.  She will begin potassium supplement-potassium chloride  20 mill equivalents daily and obtain BMP and magnesium in 1 week. Heart healthy diet and regular cardiovascular exercise encouraged.   HTN Blood pressure is borderline elevated, states blood pressure well-controlled at home.  Continue current medication regimen. Continue to follow with PCP. Heart healthy diet and regular cardiovascular exercise encouraged. Obtaining labs as noted above.   Fatigue  Etiology multifactorial.  Continue follow-up with  PCP.  Obtaining labs as noted above.  4.  Hypokalemia Potassium level from 06/2024 was 3.4 - will start potassium 20 mEq daily and recheck BMET in 1 week. Continue to follow with PCP.   Dispo: Follow-up with MD/APP in 6-8 weeks or sooner if anything changes.   Signed, Almarie Crate, NP   "

## 2024-12-27 ENCOUNTER — Ambulatory Visit: Admitting: Nurse Practitioner
